# Patient Record
Sex: Female | Born: 1980 | Race: White | Hispanic: No | Marital: Married | Smoking: Never smoker
Health system: Southern US, Community
[De-identification: ages and names within clinical notes are randomized; demographics above are authoritative.]

---

## 2017-12-20 ENCOUNTER — Ambulatory Visit
Admission: EM | Admit: 2017-12-20 | Discharge: 2017-12-20 | Disposition: A | Discharge status: Home / Self Care | Payer: BLUE CROSS/BLUE SHIELD | Attending: Family Medicine | Admitting: Family Medicine

## 2017-12-20 ENCOUNTER — Other Ambulatory Visit: Payer: Self-pay

## 2017-12-20 ENCOUNTER — Encounter: Payer: Self-pay | Admitting: Gynecology

## 2017-12-20 DIAGNOSIS — L03211 Cellulitis of face: Secondary | ICD-10-CM

## 2017-12-20 DIAGNOSIS — R220 Localized swelling, mass and lump, head: Secondary | ICD-10-CM | POA: Diagnosis not present

## 2017-12-20 DIAGNOSIS — K047 Periapical abscess without sinus: Secondary | ICD-10-CM

## 2017-12-20 MED ORDER — CEFAZOLIN SODIUM 1 G IJ SOLR
1.0000 g | Freq: Once | INTRAMUSCULAR | Status: AC
  Administered 2017-12-20: 1 g via INTRAMUSCULAR

## 2017-12-20 MED ORDER — PENICILLIN V POTASSIUM 500 MG PO TABS: 500.0000 mg | ORAL_TABLET | Freq: Four times a day (QID) | ORAL | 0 refills | Status: AC

## 2017-12-20 NOTE — ED Provider Notes (Addendum)
[facility]    CSN: [id number] Arrival date & time: 12/20/17  1046     History   Chief Complaint Chief Complaint  Patient presents with  . Oral Swelling    HPI Hayley Reid is a 37 y.o. female.   37 yo female with a c/o left facial swelling since yesterday. States she also has dental pain, multiple cavities. Denies any fevers, chills, vision problems.   The history is provided by the patient.    History reviewed. No pertinent past medical history.  There are no active problems to display for this patient.   History reviewed. No pertinent surgical history.  OB History   None      Home Medications    Prior to Admission medications   Medication Sig Start Date End Date Taking? Authorizing Provider  levonorgestrel (MIRENA, 52 MG,) 20 MCG/24HR IUD by Intrauterine route. 12/27/12  Yes [provider]  penicillin v potassium (VEETID) 500 MG tablet Take 1 tablet (500 mg total) by mouth 4 (four) times daily. 12/20/17   Payton Mccallumonty, Tab Rylee, MD    Family History Family History  Problem Relation Age of Onset  . Hyperlipidemia Mother   . Cancer Mother   . Breast cancer Mother   . Hyperlipidemia Father     Social History Social History   Tobacco Use  . Smoking status: Never Smoker  . Smokeless tobacco: Never Used  Substance Use Topics  . Alcohol use: Yes  . Drug use: Never     Allergies   Patient has no known allergies.   Review of Systems Review of Systems   Physical Exam Triage Vital Signs ED Triage Vitals  Enc Vitals Group     BP 12/20/17 1112 (!) 149/96     Pulse Rate 12/20/17 1112 100     Resp 12/20/17 1112 16     Temp 12/20/17 1112 98.5 F (36.9 C)     Temp Source 12/20/17 1112 Oral     SpO2 12/20/17 1112 98 %     Weight 12/20/17 1114 130 lb (59 kg)     Height 12/20/17 1114 5\' 6"  (1.676 m)     Head Circumference --      Peak Flow --      Pain Score 12/20/17 1114 0     Pain Loc --      Pain Edu? --      Excl. in  GC? --    No data found.  Updated Vital Signs BP (!) 149/96 (BP Location: Left Arm)   Pulse 100   Temp 98.5 F (36.9 C) (Oral)   Resp 16   Ht 5\' 6"  (1.676 m)   Wt 130 lb (59 kg)   LMP 11/30/2017   SpO2 98%   BMI 20.98 kg/m   Visual Acuity Right Eye Distance:   Left Eye Distance:   Bilateral Distance:    Right Eye Near:   Left Eye Near:    Bilateral Near:     Physical Exam  Constitutional: She appears well-developed and well-nourished. No distress.  HENT:  Mouth/Throat: Oropharynx is clear and moist. Abnormal dentition. Dental caries present. No oropharyngeal exudate, posterior oropharyngeal edema, posterior oropharyngeal erythema or tonsillar abscesses. No tonsillar exudate.  Skin: She is not diaphoretic.  Right cheek erythema, mild edema and tenderness to palpation  Nursing note and vitals reviewed.    UC Treatments / Results  Labs (all labs ordered are listed, but only abnormal results are displayed) Labs Reviewed - No data  to display  EKG None  Radiology No results found.  Procedures Procedures (including critical care time)  Medications Ordered in UC Medications  ceFAZolin (ANCEF) injection 1 g (1 g Intramuscular Given 12/20/17 1214)    Initial Impression / Assessment and Plan / UC Course  I have reviewed the triage vital signs and the nursing notes.  Pertinent labs & imaging results that were available during my care of the patient were reviewed by me and considered in my medical decision making (see chart for details).      Final Clinical Impressions(s) / UC Diagnoses   Final diagnoses:  Dental infection  Facial cellulitis     Discharge Instructions     Warm compresses to left cheek Mouth rinses 3-4 times per day    ED Prescriptions    Medication Sig Dispense Auth. Provider   penicillin v potassium (VEETID) 500 MG tablet Take 1 tablet (500 mg total) by mouth 4 (four) times daily. 40 tablet Payton Mccallum, MD     1. diagnosis  reviewed with patient; patient given Ancef 1gm IM x 1 2. rx as per orders above; reviewed possible side effects, interactions, risks and benefits  3. Recommend supportive treatment as above 4. Follow up with dentist 5. Follow-up prn if symptoms worsen or don't improve   Controlled Substance Prescriptions [place] Controlled Substance Registry consulted? Not Applicable   Payton Mccallum, MD 12/20/17 1349    Payton Mccallum, MD 12/20/17 1350

## 2017-12-20 NOTE — ED Triage Notes (Signed)
Patient c/o left facial swelling/ Per patient not sure if it is the infected upper jaw tooth that she has.

## 2017-12-20 NOTE — Discharge Instructions (Signed)
Warm compresses to left cheek Mouth rinses 3-4 times per day

## 2018-12-09 ENCOUNTER — Ambulatory Visit
Admission: EM | Admit: 2018-12-09 | Discharge: 2018-12-09 | Disposition: A | Discharge status: Home / Self Care | Payer: Worker's Compensation | Attending: Emergency Medicine | Admitting: Emergency Medicine

## 2018-12-09 ENCOUNTER — Other Ambulatory Visit: Payer: Self-pay

## 2018-12-09 ENCOUNTER — Ambulatory Visit (INDEPENDENT_AMBULATORY_CARE_PROVIDER_SITE_OTHER): Payer: Worker's Compensation

## 2018-12-09 DIAGNOSIS — W228XXA Striking against or struck by other objects, initial encounter: Secondary | ICD-10-CM | POA: Diagnosis not present

## 2018-12-09 DIAGNOSIS — Z042 Encounter for examination and observation following work accident: Secondary | ICD-10-CM

## 2018-12-09 DIAGNOSIS — S5001XA Contusion of right elbow, initial encounter: Secondary | ICD-10-CM

## 2018-12-09 DIAGNOSIS — M25521 Pain in right elbow: Secondary | ICD-10-CM

## 2018-12-09 NOTE — Discharge Instructions (Signed)
Take medication as prescribed. Rest. Ice. Stretch. Avoid heavy lifting.  Follow up with occupational health in 4 days for follow up. See above to call to schedule.  Follow up with your primary care physician this week as needed. Return to Urgent care for new or worsening concerns.

## 2018-12-09 NOTE — ED Notes (Signed)
Ice pack to elbow

## 2018-12-09 NOTE — ED Triage Notes (Signed)
Pt was at work and bumped her elbow on a metal cart. Pt with pain and swelling to the right elbow. Pain 9/10

## 2018-12-09 NOTE — ED Provider Notes (Signed)
[facility] ____________________________________________  Time seen: Approximately 4:25 PM  I have reviewed the triage vital signs and the nursing notes.   HISTORY  Chief Complaint Arm Pain   HPI Hayley Reid is a 38 y.o. female presenting for evaluation of her right elbow pain after injury that occurred this afternoon.  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  States she accidentally hit her right posterior elbow on a metal cart.  Reports pain since incident.  Denies other pain or injuries.  Denies pain radiation.  States has had some intermittent tingling in her whole right hand, but no loss of sensation.  Denies any decreased right hand strength or movement.  States pain with elbow movement.  Has applied ice.  Denies other alleviating measures.  Denies other aggravating factors.  Left-hand-dominant.  Reports otherwise doing well.  Denies recent chest pain, shortness of breath, cough or fevers.  Patient's last menstrual period was 12/04/2018.Denies pregnancy.    History reviewed. No pertinent past medical history.  There are no active problems to display for this patient.   History reviewed. No pertinent surgical history.   No current facility-administered medications for this encounter.   Current Outpatient Medications:  .  levonorgestrel (MIRENA, 52 MG,) 20 MCG/24HR IUD, by Intrauterine route., Disp: , Rfl:  .  penicillin v potassium (VEETID) 500 MG tablet, Take 1 tablet (500 mg total) by mouth 4 (four) times daily., Disp: 40 tablet, Rfl: 0  Allergies Patient has no known allergies.  Family History  Problem Relation Age of Onset  . Hyperlipidemia Mother   . Cancer Mother   . Breast cancer Mother   . Hyperlipidemia Father     Social History Social History   Tobacco Use  . Smoking status: Never Smoker  . Smokeless tobacco: Never Used  Substance Use Topics  . Alcohol use: Yes    Comment: social  . Drug  use: Never    Review of Systems Constitutional: No fever ENT: No sore throat. Cardiovascular: Denies chest pain. Respiratory: Denies shortness of breath. Gastrointestinal: No abdominal pain.  Musculoskeletal: Positive right elbow pain. Skin: Negative for rash. Neurological: Negative for focal weakness or loss of sensation.   ____________________________________________   PHYSICAL EXAM:  VITAL SIGNS: ED Triage Vitals  Enc Vitals Group     BP 12/09/18 1613 (!) 159/91     Pulse Rate 12/09/18 1613 86     Resp 12/09/18 1613 16     Temp 12/09/18 1613 98.6 F (37 C)     Temp Source 12/09/18 1613 Oral     SpO2 12/09/18 1613 100 %     Weight 12/09/18 1611 130 lb (59 kg)     Height 12/09/18 1611 5\' 6"  (1.676 m)     Head Circumference --      Peak Flow --      Pain Score 12/09/18 1610 9     Pain Loc --      Pain Edu? --      Excl. in GC? --     Constitutional: Alert and oriented. Well appearing and in no acute distress. Eyes: Conjunctivae are normal.  ENT      Head: Normocephalic and atraumatic. Cardiovascular: Normal rate, regular rhythm. Grossly normal heart sounds.  Good peripheral circulation. Respiratory: Normal respiratory effort without tachypnea nor retractions. Breath sounds are clear and equal bilaterally. No wheezes, rales, rhonchi. Musculoskeletal:  Steady gait. Bilateral distal radial pulses equal and easily palpated. Except: Right posterior  elbow mild to moderate tenderness to direct palpation with minimal localized swelling, painful and limited elbow extension, able to fully flex left elbow with minimal pain, right upper extremity otherwise nontender, no paresthesias, bilateral hand grip strong and equal, right hand normal distal sensation and capillary refill. Neurologic:  Normal speech and language. Speech is normal. No gait instability.  Skin:  Skin is warm, dry and intact. No rash noted. Psychiatric: Mood and affect are normal. Speech and behavior are normal.  Patient exhibits appropriate insight and judgment   ___________________________________________   LABS (all labs ordered are listed, but only abnormal results are displayed)   RADIOLOGY  Dg Elbow Complete Right  Result Date: 12/09/2018 CLINICAL DATA:  Pelvis hit elbow, pain. EXAM: RIGHT ELBOW - COMPLETE 3+ VIEW COMPARISON:  None. FINDINGS: There is no evidence of fracture, dislocation, or joint effusion. There is no evidence of arthropathy or other focal bone abnormality. Soft tissues are unremarkable. IMPRESSION: Negative. Electronically Signed   By: Rolm Baptise M.D.   On: 12/09/2018 16:36   ____________________________________________   PROCEDURES Procedures   INITIAL IMPRESSION / ASSESSMENT AND PLAN / ED COURSE  Pertinent labs & imaging results that were available during my care of the patient were reviewed by me and considered in my medical decision making (see chart for details).  Well-appearing patient.  No acute distress.  Right posterior elbow injury today.  Worker's Compensation injury.  Right elbow x-ray as above per radiologist, negative.  Suspect contusion injury.  Ice, over-the-counter ibuprofen, stretching.  Follow-up with occupational health in 4 days for reevaluation.  No lifting greater than 5 pounds with right arm until event reevaluation.  Discussed follow up with Primary care physician this week as needed.. Discussed follow up and return parameters including no resolution or any worsening concerns. Patient verbalized understanding and agreed to plan.   ____________________________________________   FINAL CLINICAL IMPRESSION(S) / ED DIAGNOSES  Final diagnoses:  Contusion of right elbow, initial encounter     ED Discharge Orders    None       Note: This dictation was prepared with Dragon dictation along with smaller phrase technology. Any transcriptional errors that result from this process are unintentional.         Marylene Land, NP 12/09/18  1722

## 2019-11-21 IMAGING — CR RIGHT ELBOW - COMPLETE 3+ VIEW
4 series · 4 of 4 positions shown · non-contrast
Comparison: None.

CLINICAL DATA: Pelvis hit elbow, pain.

EXAM:
RIGHT ELBOW - COMPLETE 3+ VIEW

[elbow ap]
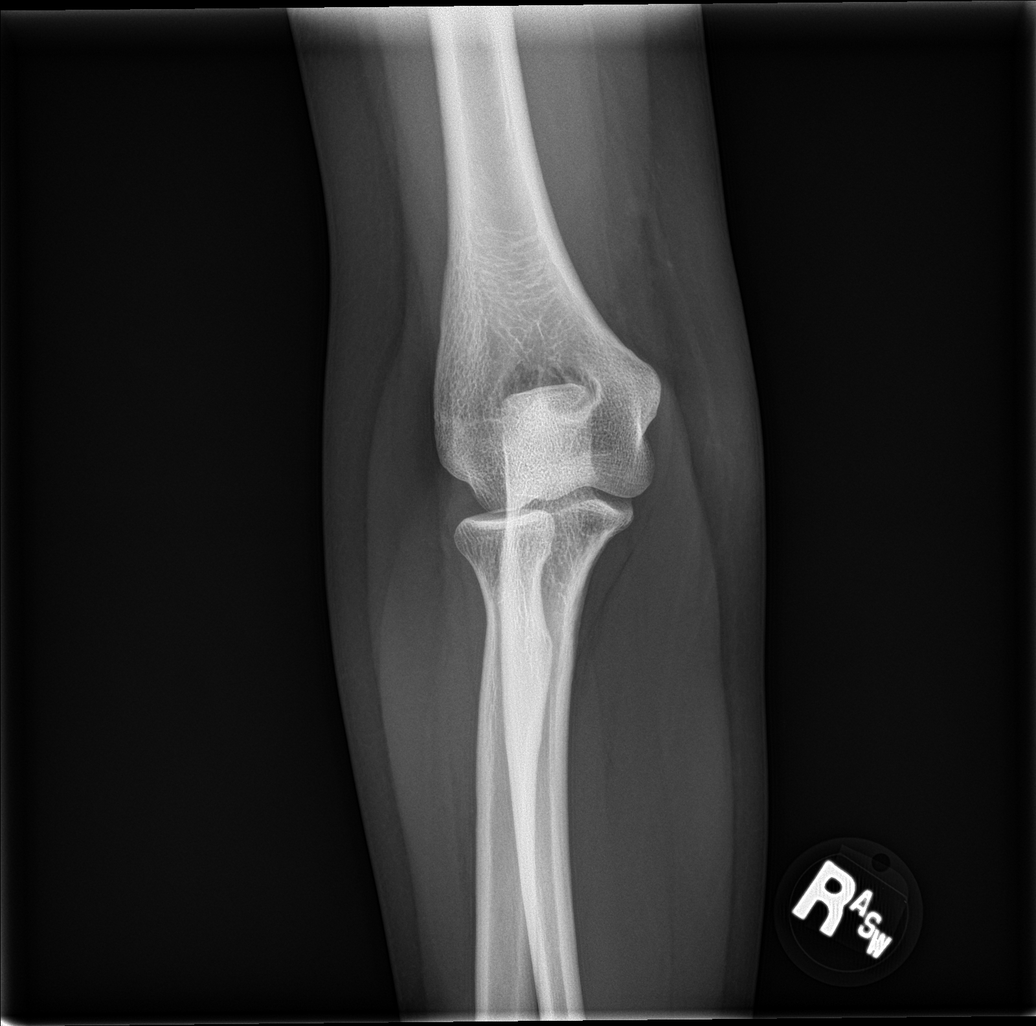

[elbow obl (1 of 2)]
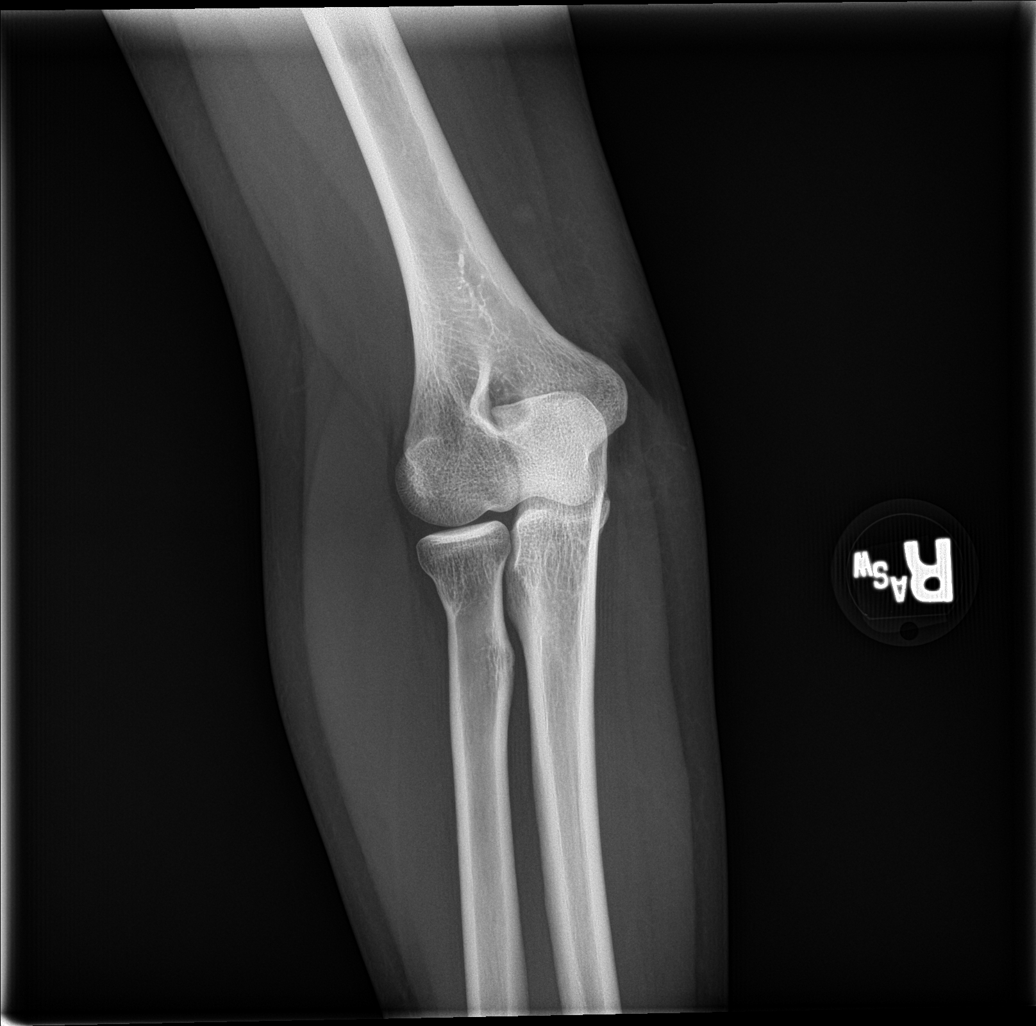

[elbow obl (2 of 2)]
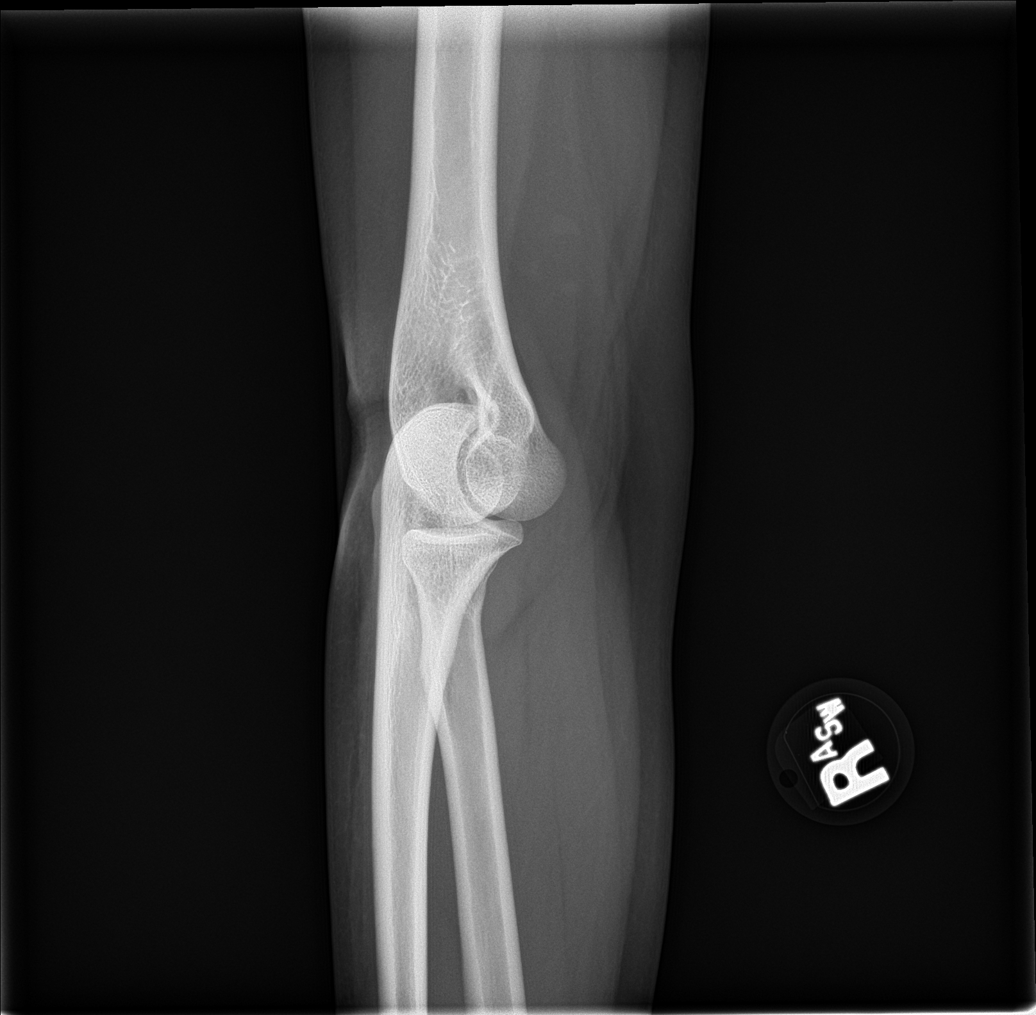

[elbow lat]
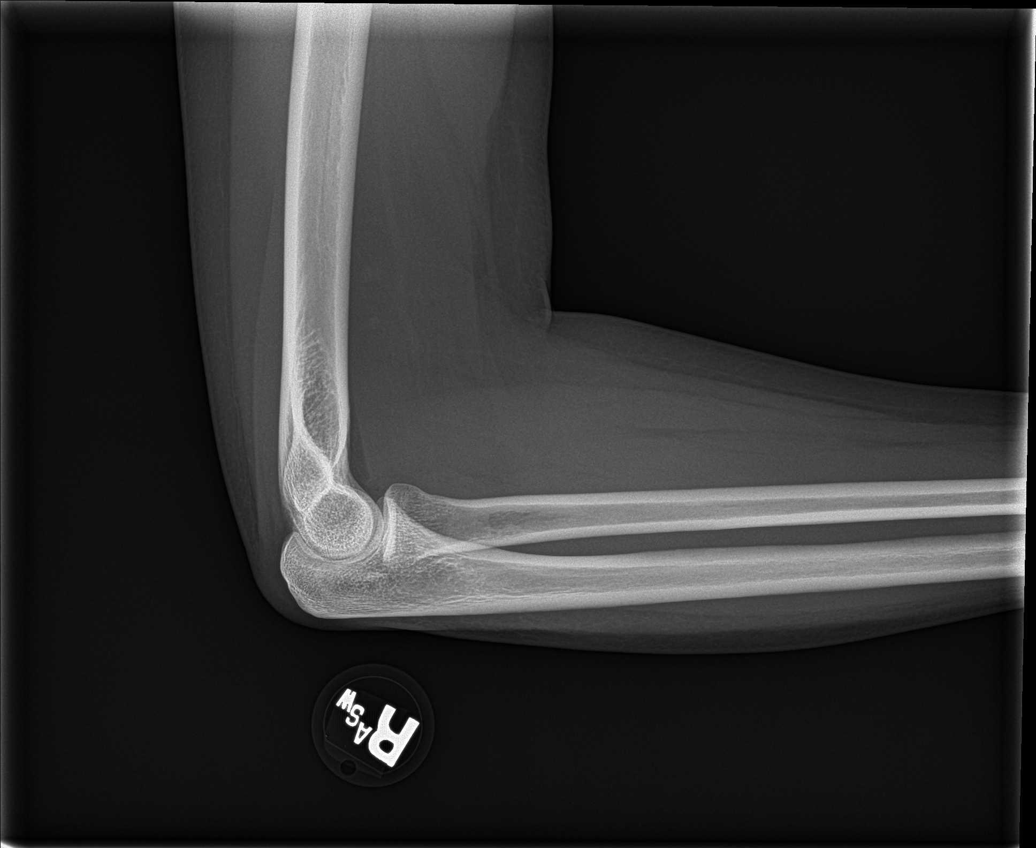

[4 of 4 positions shown; findings below may reference images not displayed]

FINDINGS: There is no evidence of fracture, dislocation, or joint effusion.
There is no evidence of arthropathy or other focal bone abnormality.
Soft tissues are unremarkable.
IMPRESSION: Negative.
